# Patient Record
Sex: Male | Born: 1970 | Race: Black or African American | Hispanic: No | Marital: Single | Smoking: Never smoker
Health system: Southern US, Community
[De-identification: ages and names within clinical notes are randomized; demographics above are authoritative.]

---

## 2000-03-08 ENCOUNTER — Emergency Department (HOSPITAL_COMMUNITY): Admission: EM | Admit: 2000-03-08 | Discharge: 2000-03-08 | Payer: Self-pay | Admitting: Internal Medicine

## 2002-09-25 ENCOUNTER — Emergency Department (HOSPITAL_COMMUNITY): Admission: AD | Admit: 2002-09-25 | Discharge: 2002-09-25 | Payer: Self-pay | Admitting: Emergency Medicine

## 2002-09-25 ENCOUNTER — Encounter: Payer: Self-pay | Admitting: Emergency Medicine

## 2004-08-31 ENCOUNTER — Emergency Department (HOSPITAL_COMMUNITY): Admission: EM | Admit: 2004-08-31 | Discharge: 2004-08-31 | Payer: Self-pay | Admitting: Emergency Medicine

## 2004-09-07 ENCOUNTER — Emergency Department (HOSPITAL_COMMUNITY): Admission: EM | Admit: 2004-09-07 | Discharge: 2004-09-07 | Payer: Self-pay | Admitting: Emergency Medicine

## 2009-07-06 ENCOUNTER — Emergency Department (HOSPITAL_COMMUNITY): Admission: EM | Admit: 2009-07-06 | Discharge: 2009-07-06 | Payer: Self-pay | Admitting: Emergency Medicine

## 2011-01-26 ENCOUNTER — Ambulatory Visit: Payer: Self-pay

## 2011-07-11 ENCOUNTER — Emergency Department (HOSPITAL_COMMUNITY)
Admission: EM | Admit: 2011-07-11 | Discharge: 2011-07-11 | Disposition: A | Discharge status: Home / Self Care | Payer: Self-pay | Attending: Emergency Medicine | Admitting: Emergency Medicine

## 2011-07-11 ENCOUNTER — Encounter (HOSPITAL_COMMUNITY): Payer: Self-pay | Admitting: *Deleted

## 2011-07-11 DIAGNOSIS — T148XXA Other injury of unspecified body region, initial encounter: Secondary | ICD-10-CM

## 2011-07-11 DIAGNOSIS — M546 Pain in thoracic spine: Secondary | ICD-10-CM | POA: Insufficient documentation

## 2011-07-11 DIAGNOSIS — M25519 Pain in unspecified shoulder: Secondary | ICD-10-CM | POA: Insufficient documentation

## 2011-07-11 MED ORDER — DIAZEPAM 5 MG PO TABS: 5.0000 mg | ORAL_TABLET | Freq: Two times a day (BID) | ORAL | Status: AC

## 2011-07-11 NOTE — Discharge Instructions (Signed)
Muscle Strain A muscle strain (pulled muscle) happens when a muscle is over-stretched. Recovery usually takes 5 to 6 weeks.  Take muscle relaxer as needed for pain.  Do not drive or operate heavy machinery while taking muscle relaxer.  HOME CARE   Put ice on the injured area.   Put ice in a plastic bag.   Place a towel between your skin and the bag.   Leave the ice on for 15 to 20 minutes at a time, every hour for the first 2 days.   Do not use the muscle for several days or until your doctor says you can. Do not use the muscle if you have pain.   Wrap the injured area with an elastic bandage for comfort. Do not put it on too tightly.   Only take medicine as told by your doctor.   Warm up before exercise. This helps prevent muscle strains.  GET HELP RIGHT AWAY IF:  There is increased pain or puffiness (swelling) in the affected area. MAKE SURE YOU:   Understand these instructions.   Will watch your condition.   Will get help right away if you are not doing well or get worse.  Document Released: 10/30/2007 Document Revised: 01/09/2011 Document Reviewed: 10/30/2007 [facility] Patient Information 2012 [place], [place].

## 2011-07-11 NOTE — ED Notes (Signed)
Pt c/o pain in neck and shoulders x 2 wks; pain in right thumb due to thumb infection; states unable to do job because of thumb; hard to sleep at night

## 2011-07-11 NOTE — ED Provider Notes (Signed)
History     CSN: [id number]  Arrival date & time 07/11/11  0413   First MD Initiated Contact with Patient 07/11/11 0425      Chief Complaint  Patient presents with  . Shoulder Pain    (Consider location/radiation/quality/duration/timing/severity/associated sxs/prior treatment) HPI Comments: Patient reports that he has been having bilateral shoulder pain for the past 2 weeks.  Pain located in the scapula region bilaterally and across the upper back.  No thoracic spine tenderness.  Pain unchanged over the past 2 weeks.  He has taken aspirin for the pain, which does not help.  He has also gone and had a professional massage, which helped somewhat.  He reports that the pain feels like a muscle strain.  He does a lot of lifting at his job.  He denies any acute trauma.  He has also been complaining of pain in the right thumb around the nail bed.  No drainage.  No erythema.  No acute injury.  He is able to move thumb without difficulty.    Patient is a 41 y.o. male presenting with shoulder pain. The history is provided by the patient.  Shoulder Pain Pertinent negatives include no chills, fever, headaches, nausea, neck pain, numbness or vomiting.    History reviewed. No pertinent past medical history.  History reviewed. No pertinent past surgical history.  History reviewed. No pertinent family history.  History  Substance Use Topics  . Smoking status: Never Smoker   . Smokeless tobacco: Not on file  . Alcohol Use: No      Review of Systems  Constitutional: Negative for fever and chills.  HENT: Negative for neck pain and neck stiffness.   Respiratory: Negative for shortness of breath.   Gastrointestinal: Negative for nausea and vomiting.  Skin: Negative for color change.  Neurological: Negative for dizziness, syncope, light-headedness, numbness and headaches.    Allergies  Review of patient's allergies indicates no known allergies.  Home Medications  No current outpatient  prescriptions on file.  BP 159/112  Pulse 87  Temp(Src) 97.9 F (36.6 C) (Oral)  Resp 16  SpO2 100%  Physical Exam  Nursing note and vitals reviewed. Constitutional: He appears well-developed and well-nourished. No distress.  HENT:  Head: Normocephalic and atraumatic.  Mouth/Throat: Oropharynx is clear and moist.  Neck: Normal range of motion. Neck supple.  Cardiovascular: Normal rate, regular rhythm and normal heart sounds.   Pulses:      Radial pulses are 2+ on the right side, and 2+ on the left side.  Pulmonary/Chest: Effort normal and breath sounds normal. No respiratory distress.  Musculoskeletal: Normal range of motion.       Right shoulder: He exhibits normal range of motion, no tenderness, no bony tenderness, no deformity, normal pulse and normal strength.       Left shoulder: He exhibits normal range of motion, no tenderness, no bony tenderness, no swelling, no deformity, normal pulse and normal strength.       Cervical back: He exhibits normal range of motion, no tenderness, no bony tenderness, no swelling, no edema and no deformity.       Thoracic back: He exhibits normal range of motion, no tenderness, no bony tenderness, no swelling, no edema and no deformity.       Full ROM of DIP and MCP or right thumb.  Neurological: He is alert.  Skin: Skin is warm and dry. He is not diaphoretic. No erythema.       No erythema or right thumb.  No paronychia.  No drainage.  Nail appears normal.    Psychiatric: He has a normal mood and affect.    ED Course  Procedures (including critical care time)  Labs Reviewed - No data to display No results found.  No diagnosis found.    MDM  Patient presenting with upper back pain.  He reports that the pain feels like a muscle strain.   Normal neurological exam.  He does heavy lifting at work.  No fevers or chills.          Pascal Lux [place], PA-C 07/14/11 0002

## 2011-07-14 NOTE — ED Provider Notes (Signed)
Medical screening examination/treatment/procedure(s) were performed by non-physician practitioner and as supervising physician I was immediately available for consultation/collaboration.  Toy Baker, MD 07/14/11 1455

## 2011-12-30 IMAGING — CR DG HAND COMPLETE 3+V*R*
3 series · 3 of 3 positions shown · non-contrast
Comparison: None.

CLINICAL DATA: Cut thumb on glass

RIGHT HAND - COMPLETE 3+ VIEW

[x hand pa right]
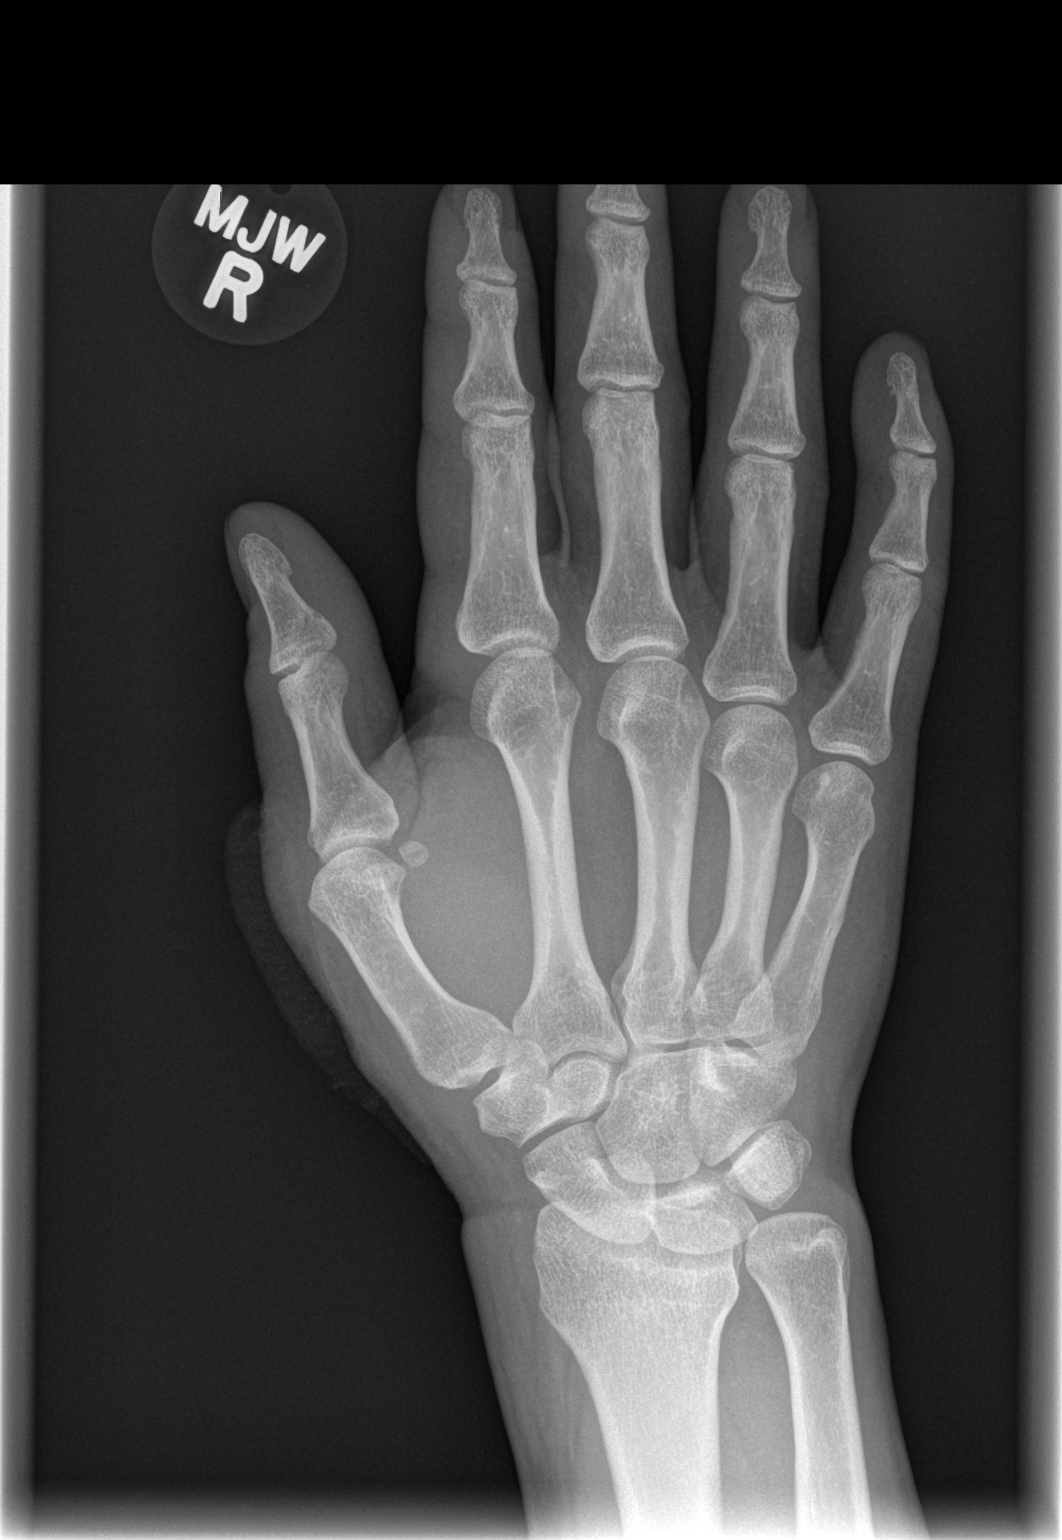

[x hand oblique right]
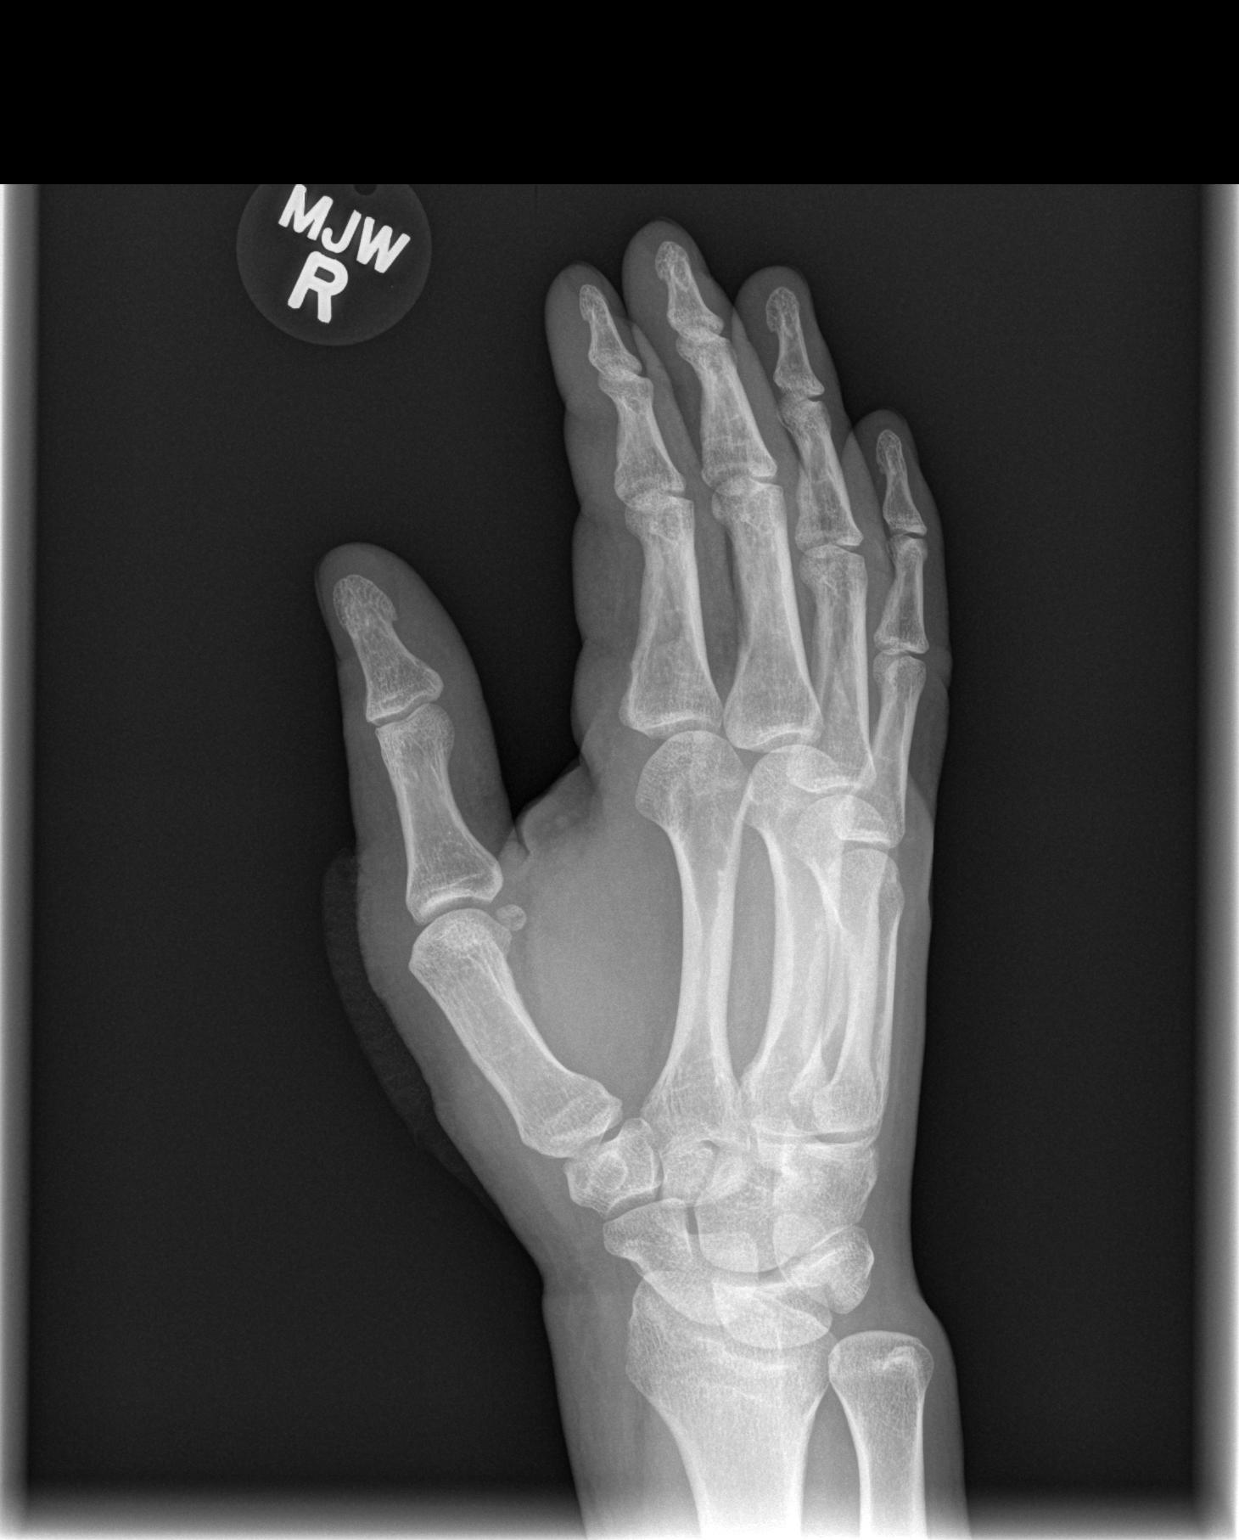

[x hand lat right]
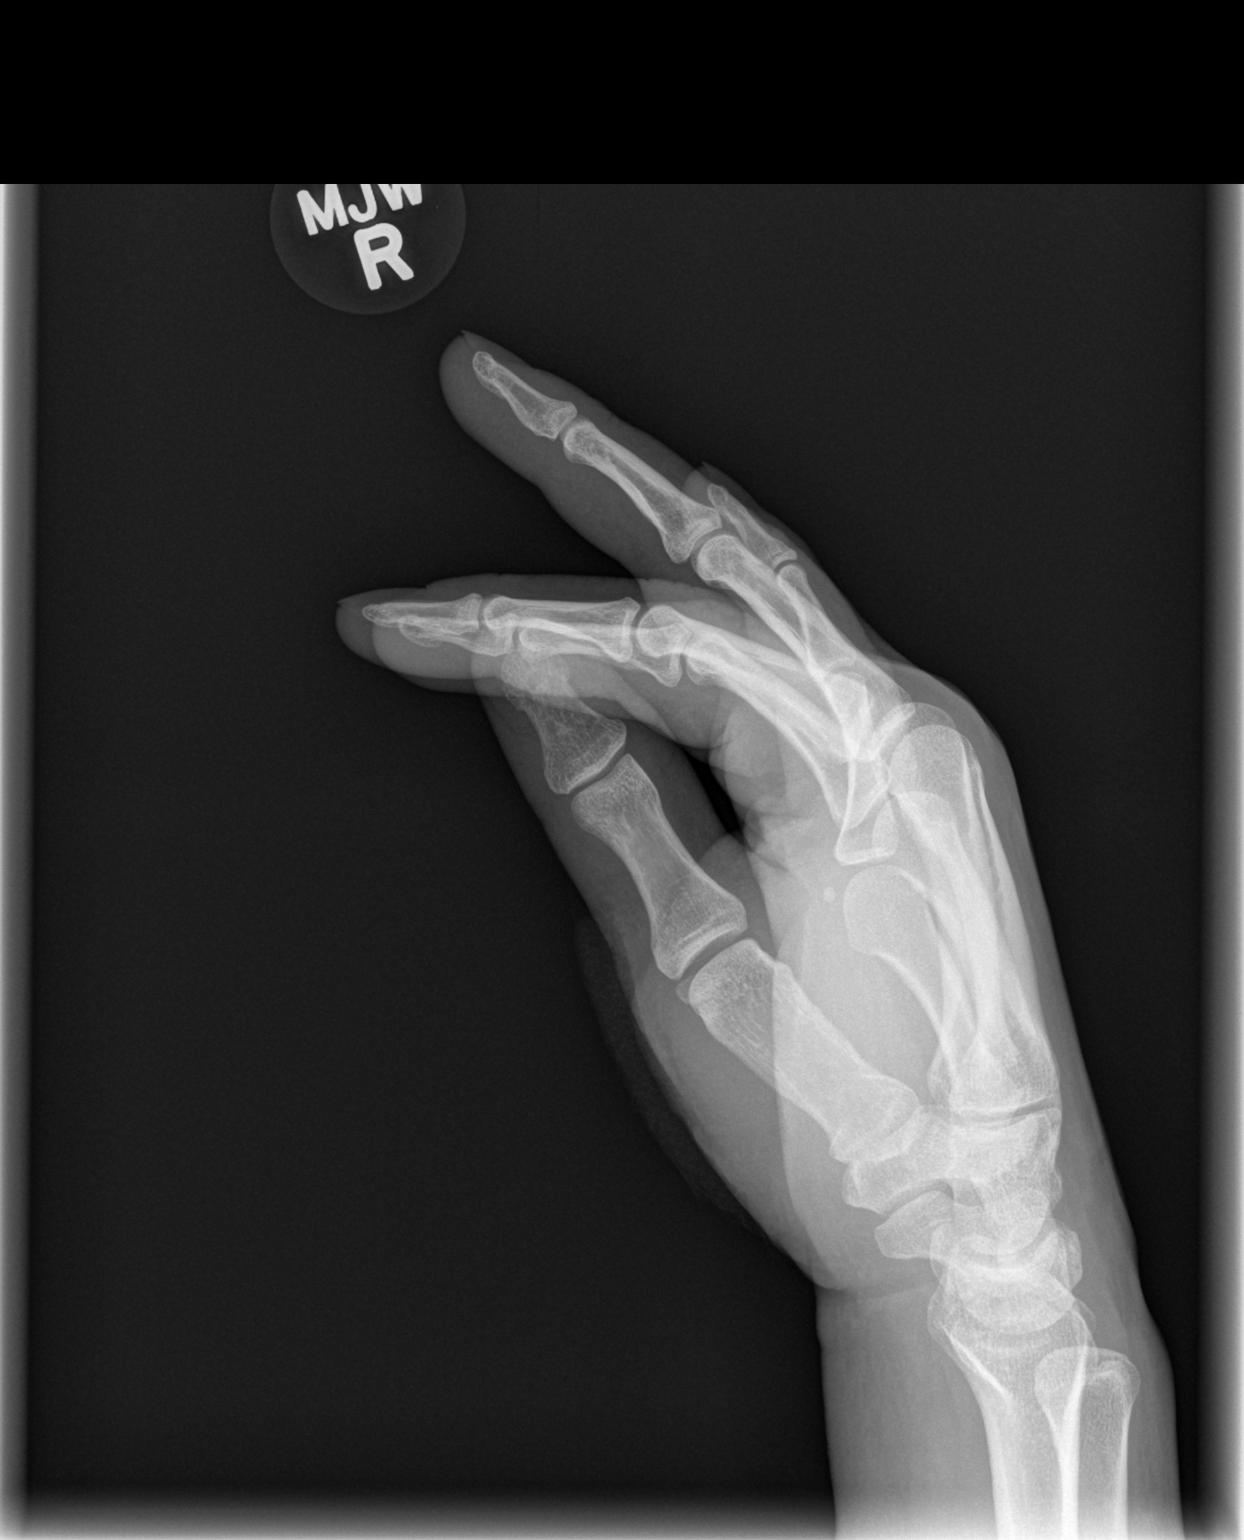

[3 of 3 positions shown; findings below may reference images not displayed]

FINDINGS: No radiopaque foreign body.  No acute bony abnormality.
IMPRESSION: Negative right hand.

## 2017-02-21 ENCOUNTER — Emergency Department (HOSPITAL_COMMUNITY): Admission: EM | Admit: 2017-02-21 | Discharge: 2017-02-21 | Discharge status: Against Medical Advice | Payer: Self-pay

## 2017-02-21 NOTE — ED Notes (Signed)
Pt has been called twice and not answered.

## 2017-02-21 NOTE — ED Notes (Signed)
Called for triage no response. x2
